# Patient Record
Sex: Male | Born: 1990 | Hispanic: No | Marital: Single | Smoking: Never smoker
Health system: Southern US, Community
[De-identification: ages and names within clinical notes are randomized; demographics above are authoritative.]

---

## 2008-01-24 ENCOUNTER — Emergency Department: Payer: Self-pay | Admitting: Emergency Medicine

## 2008-08-01 ENCOUNTER — Emergency Department (HOSPITAL_COMMUNITY): Admission: EM | Admit: 2008-08-01 | Discharge: 2008-08-01 | Payer: Self-pay | Admitting: Emergency Medicine

## 2008-11-04 ENCOUNTER — Observation Stay (HOSPITAL_COMMUNITY): Admission: EM | Admit: 2008-11-04 | Discharge: 2008-11-04 | Payer: Self-pay | Admitting: Emergency Medicine

## 2009-05-01 ENCOUNTER — Emergency Department (HOSPITAL_COMMUNITY): Admission: EM | Admit: 2009-05-01 | Discharge: 2009-05-01 | Payer: Self-pay | Admitting: Emergency Medicine

## 2011-02-18 LAB — PROTIME-INR
INR: 1.1 (ref 0.00–1.49)
Prothrombin Time: 14.2 seconds (ref 11.6–15.2)

## 2011-02-18 LAB — POCT I-STAT, CHEM 8
Chloride: 106 mEq/L (ref 96–112)
Creatinine, Ser: 1.3 mg/dL (ref 0.4–1.5)
Hemoglobin: 15.6 g/dL (ref 13.0–17.0)
Potassium: 3.2 mEq/L — ABNORMAL LOW (ref 3.5–5.1)
Sodium: 141 mEq/L (ref 135–145)

## 2011-02-18 LAB — ETHANOL: Alcohol, Ethyl (B): 216 mg/dL — ABNORMAL HIGH (ref 0–10)

## 2011-02-18 LAB — RAPID URINE DRUG SCREEN, HOSP PERFORMED: Benzodiazepines: POSITIVE — AB
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
## 2011-12-25 ENCOUNTER — Emergency Department (HOSPITAL_COMMUNITY): Payer: No Typology Code available for payment source

## 2011-12-25 ENCOUNTER — Emergency Department (HOSPITAL_COMMUNITY)
Admission: EM | Admit: 2011-12-25 | Discharge: 2011-12-25 | Disposition: A | Discharge status: Home Health | Payer: No Typology Code available for payment source | Attending: Emergency Medicine | Admitting: Emergency Medicine

## 2011-12-25 ENCOUNTER — Encounter (HOSPITAL_COMMUNITY): Payer: Self-pay | Admitting: *Deleted

## 2011-12-25 DIAGNOSIS — S139XXA Sprain of joints and ligaments of unspecified parts of neck, initial encounter: Secondary | ICD-10-CM | POA: Insufficient documentation

## 2011-12-25 DIAGNOSIS — Z79899 Other long term (current) drug therapy: Secondary | ICD-10-CM | POA: Insufficient documentation

## 2011-12-25 DIAGNOSIS — F17200 Nicotine dependence, unspecified, uncomplicated: Secondary | ICD-10-CM | POA: Insufficient documentation

## 2011-12-25 DIAGNOSIS — M546 Pain in thoracic spine: Secondary | ICD-10-CM | POA: Insufficient documentation

## 2011-12-25 DIAGNOSIS — R51 Headache: Secondary | ICD-10-CM | POA: Insufficient documentation

## 2011-12-25 DIAGNOSIS — S335XXA Sprain of ligaments of lumbar spine, initial encounter: Secondary | ICD-10-CM | POA: Insufficient documentation

## 2011-12-25 DIAGNOSIS — S39012A Strain of muscle, fascia and tendon of lower back, initial encounter: Secondary | ICD-10-CM

## 2011-12-25 DIAGNOSIS — M545 Low back pain, unspecified: Secondary | ICD-10-CM | POA: Insufficient documentation

## 2011-12-25 DIAGNOSIS — S161XXA Strain of muscle, fascia and tendon at neck level, initial encounter: Secondary | ICD-10-CM

## 2011-12-25 DIAGNOSIS — M542 Cervicalgia: Secondary | ICD-10-CM | POA: Insufficient documentation

## 2011-12-25 MED ORDER — DIAZEPAM 5 MG PO TABS: 5.0000 mg | ORAL_TABLET | Freq: Two times a day (BID) | ORAL | Status: AC

## 2011-12-25 MED ORDER — NAPROXEN 500 MG PO TABS: 500.0000 mg | ORAL_TABLET | Freq: Two times a day (BID) | ORAL | Status: DC

## 2011-12-25 MED ORDER — KETOROLAC TROMETHAMINE 60 MG/2ML IM SOLN
60.0000 mg | Freq: Once | INTRAMUSCULAR | Status: AC
  Administered 2011-12-25: 60 mg via INTRAMUSCULAR
  Filled 2011-12-25: qty 2
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
## 2011-12-25 NOTE — ED Provider Notes (Signed)
Medical screening examination/treatment/procedure(s) were performed by non-physician practitioner and as supervising physician I was immediately available for consultation/collaboration.  Doug Sou, MD 12/25/11 [phone]

## 2011-12-25 NOTE — Discharge Instructions (Signed)
Take naprosyn as prescribed for pain. Take valium as prescribed as needed for muscle spasms. Rest. Stretch daily. Heating pads to the back. Your x-rays were normal today. Follow up in 3-5 days if not improving.   Back Pain, Adult Low back pain is very common. About 1 in 5 people have back pain.The cause of low back pain is rarely dangerous. The pain often gets better over time.About half of people with a sudden onset of back pain feel better in just 2 weeks. About 8 in 10 people feel better by 6 weeks.  CAUSES Some common causes of back pain include:  Strain of the muscles or ligaments supporting the spine.   Wear and tear (degeneration) of the spinal discs.   Arthritis.   Direct injury to the back.  DIAGNOSIS Most of the time, the direct cause of low back pain is not known.However, back pain can be treated effectively even when the exact cause of the pain is unknown.Answering your caregiver's questions about your overall health and symptoms is one of the most accurate ways to make sure the cause of your pain is not dangerous. If your caregiver needs more information, he or she may order lab work or imaging tests (X-rays or MRIs).However, even if imaging tests show changes in your back, this usually does not require surgery. HOME CARE INSTRUCTIONS For many people, back pain returns.Since low back pain is rarely dangerous, it is often a condition that people can learn to [facility] their own.   Remain active. It is stressful on the back to sit or stand in one place. Do not sit, drive, or stand in one place for more than 30 minutes at a time. Take short walks on level surfaces as soon as pain allows.Try to increase the length of time you walk each day.   Do not stay in bed.Resting more than 1 or 2 days can delay your recovery.   Do not avoid exercise or work.Your body is made to move.It is not dangerous to be active, even though your back may hurt.Your back will likely heal faster if  you return to being active before your pain is gone.   Pay attention to your body when you bend and lift. Many people have less discomfortwhen lifting if they bend their knees, keep the load close to their bodies,and avoid twisting. Often, the most comfortable positions are those that put less stress on your recovering back.   Find a comfortable position to sleep. Use a firm mattress and lie on your side with your knees slightly bent. If you lie on your back, put a pillow under your knees.   Only take over-the-counter or prescription medicines as directed by your caregiver. Over-the-counter medicines to reduce pain and inflammation are often the most helpful.Your caregiver may prescribe muscle relaxant drugs.These medicines help dull your pain so you can more quickly return to your normal activities and healthy exercise.   Put ice on the injured area.   Put ice in a plastic bag.   Place a towel between your skin and the bag.   Leave the ice on for 15 to 20 minutes, 3 to 4 times a day for the first 2 to 3 days. After that, ice and heat may be alternated to reduce pain and spasms.   Ask your caregiver about trying back exercises and gentle massage. This may be of some benefit.   Avoid feeling anxious or stressed.Stress increases muscle tension and can worsen back pain.It is important to  recognize when you are anxious or stressed and learn ways to manage it.Exercise is a great option.  SEEK MEDICAL CARE IF:  You have pain that is not relieved with rest or medicine.   You have pain that does not improve in 1 week.   You have new symptoms.   You are generally not feeling well.  SEEK IMMEDIATE MEDICAL CARE IF:   You have pain that radiates from your back into your legs.   You develop new bowel or bladder control problems.   You have unusual weakness or numbness in your arms or legs.   You develop nausea or vomiting.   You develop abdominal pain.   You feel faint.  Document  Released: 10/21/2005 Document Revised: 07/03/2011 Document Reviewed: 03/11/2011 [facility] Patient Information 2012 [place], [place].
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
## 2012-04-27 ENCOUNTER — Encounter (HOSPITAL_COMMUNITY): Payer: Self-pay | Admitting: *Deleted

## 2012-04-27 ENCOUNTER — Emergency Department (HOSPITAL_COMMUNITY)
Admission: EM | Admit: 2012-04-27 | Discharge: 2012-04-27 | Disposition: A | Discharge status: Home / Self Care | Payer: Self-pay | Attending: Emergency Medicine | Admitting: Emergency Medicine

## 2012-04-27 ENCOUNTER — Emergency Department (HOSPITAL_COMMUNITY): Payer: Self-pay

## 2012-04-27 DIAGNOSIS — H1130 Conjunctival hemorrhage, unspecified eye: Secondary | ICD-10-CM | POA: Insufficient documentation

## 2012-04-27 DIAGNOSIS — S0285XA Fracture of orbit, unspecified, initial encounter for closed fracture: Secondary | ICD-10-CM

## 2012-04-27 DIAGNOSIS — K029 Dental caries, unspecified: Secondary | ICD-10-CM | POA: Insufficient documentation

## 2012-04-27 DIAGNOSIS — S0590XA Unspecified injury of unspecified eye and orbit, initial encounter: Secondary | ICD-10-CM

## 2012-04-27 DIAGNOSIS — S0280XA Fracture of other specified skull and facial bones, unspecified side, initial encounter for closed fracture: Secondary | ICD-10-CM | POA: Insufficient documentation

## 2012-04-27 DIAGNOSIS — S0510XA Contusion of eyeball and orbital tissues, unspecified eye, initial encounter: Secondary | ICD-10-CM | POA: Insufficient documentation

## 2012-04-27 MED ORDER — MORPHINE SULFATE 4 MG/ML IJ SOLN
4.0000 mg | Freq: Once | INTRAMUSCULAR | Status: AC
  Administered 2012-04-27: 4 mg via INTRAMUSCULAR
  Filled 2012-04-27: qty 1

## 2012-04-27 MED ORDER — OXYCODONE-ACETAMINOPHEN 5-325 MG PO TABS: 1.0000 | ORAL_TABLET | ORAL | Status: AC | PRN

## 2012-04-27 MED ORDER — TETRACAINE HCL 0.5 % OP SOLN
2.0000 [drp] | Freq: Once | OPHTHALMIC | Status: AC
  Administered 2012-04-27: 2 [drp] via OPHTHALMIC

## 2012-04-27 MED ORDER — TETRACAINE HCL 0.5 % OP SOLN
OPHTHALMIC | Status: AC
  Administered 2012-04-27: 2 [drp] via OPHTHALMIC
  Filled 2012-04-27: qty 2

## 2012-04-27 MED ORDER — AMOXICILLIN-POT CLAVULANATE 875-125 MG PO TABS: 1.0000 | ORAL_TABLET | Freq: Two times a day (BID) | ORAL | Status: AC
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
## 2012-04-27 NOTE — ED Notes (Signed)
Pt to and from CT, pt ambulatory.
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
## 2012-04-27 NOTE — Discharge Instructions (Signed)
Orbital Floor Fracture, Blowout  The eye sits in the bony structure of the skull called the orbit. The upper and outside walls of the orbit are very thick and strong. These walls protect the eye if the head is struck from the top or side of the eye. However, the inside wall near the nose and the orbit floor are very thin and weak. The bony floor of the orbit also acts as the roof of the air-filled space (sinus) below the orbit.  If the eye receives a direct blow from the front, all the tissues around the eye are briefly pressed together. This makes the orbital wall pressure very high. Since the weakest walls tend to give way first, the inside wall or the orbit floor may break. If the floor fractures, the tissues around the eye, including the muscle that is used to make the eye look down, may become trapped within the fracture as the floor of the orbit "blows out" into the sinus below.   CAUSES   Orbital floor fractures are caused by direct (blunt) trauma to the region of the eye.  SYMPTOMS   Assuming that there has been no injury to the eye itself, symptoms can include:   Puffiness (swelling) and bruising around the eye area (black eye).   A gurgling sound when pressure is placed on the eye area. This sound comes from air that has escaped from the sinus into the space around the eye (orbital emphysema).   Seeing two of everything - one object being higher than the other (vertical diplopia). This is the result of the muscle that moves the eye down being trapped within the fracture. Since it cannot relax, the eye is being held in a downward position relative to the other eye and cannot look up. Vertical diplopia from an orbital floor fracture is worse when looking up.   Pain around the eye when looking up.   One eye looks sunken compared to the other eye (enophthalmos).   Numbness of the cheek and upper gum on the same side of the face with the floor fracture. This is a result of nerve injury to these areas.  This nerve runs in a groove along the bone of the orbital floor on its way to the cheek and upper gums.  DIAGNOSIS   The diagnosis of an orbital floor fracture is suspected during an eye exam by an ophthalmologist. It is confirmed by X-rays or CT scan of the eye region.  TREATMENT    Orbital floor fractures are not usually treated until all of the swelling around the eye has gone away. This may take 1 or 2 weeks. Once the swelling has gone down, an ophthalmologist will see if if the muscle below the eye is still trapped within the fracture.   If there is no sign of a trapped muscle or vertical diplopia, treatment is not necessary.   If there is double vision only when looking up, a decision may be made to not do anything since most people do not spend a lot of time looking up. This may depend on the person's profession. For instance, a plumber or electrician may spend a large part of their day looking up and would therefore need treatment.   If there is persistent vertical double vision even when looking straight ahead, the ophthalmologist may try to free the muscle in the office. If this is unsuccessful, surgery is often needed.  SEEK IMMEDIATE MEDICAL CARE IF:   You have had   A drop in vision in either eye.   Swelling and bruising around either eye.   One eye seems to be "sunken" compared to the other.   You see two of everything with both eyes open when looking in any direction.   The two images get further apart when looking in a certain direction - especially up.   You have numbness of the cheek and upper gums on the side of the injury.   You develop an unexplained oral temperature over 102 F (38.9 C), or as your caregiver suggests.  Document Released: 04/16/2001 Document Revised: 10/10/2011 Document Reviewed: 02/15/2008 [facility] Patient Information 2012 [place], [place].  Fractura de suelo orbital, estallido (Orbital Floor  Fracture, Blowout) El ojo se apoya sobre una estructura sea del crneo llamada rbita. Las paredes superiores y exteriores de la rbita son Karren Burly y fuertes. Las mismas protegen el ojo si la cabeza recibe un golpe desde arriba o desde un lado. Sin embargo, la pared interior cercana a la nariz y el suelo de la rbita son Lynnae Sandhoff finas y dbiles. El suelo seo de la rbita tambin acta como techo del espacio lleno de aire (sinus) debajo de la rbita. Si el ojo recibe un golpe directo desde el frente, todos los tejidos que rodean el ojo se presionarn entre s Youth worker. Esto aumenta la presin de Nature conservation officer. Debido a que las paredes ms dbiles suelen dar Stage manager, la pared interna o el suelo de la rbita podra romperse. Si se fractura el suelo orbital, los tejidos que rodean el ojo, incluido el msculo que se [place] para Radio producer que el ojo mire [place], podra quedar atrapado dentro de la fractura mientras que el suelo orbital "explota" hacia el sinus.  CAUSAS Las fracturas de suelo orbital estn causadas por traumatismos directos (contundentes) en la regin del ojo. SNTOMAS Si suponemos que no ha habido una lesin directa al ojo, los sntomas pueden incluir:  Hinchazn (inflamacin) y dolor en el rea del ojo ("ojo negro").   Un sonido ahogado cuando se hace presin sobre el rea. Este sonido proviene del aire que se escapa desde el sinus hacia el espacio que rodea el ojo (enfisema orbital).   Visin doble - un objeto ms alto que el otro (diplopa vertical). Es el resultado de que el msculo que mueve al ojo est atrapado con la fractura. Debido a que no puede relajarse, el ojo queda en una posicin hacia abajo en relacin con el otro y no puede mirar [place]. La diplopa vertical por una fractura de suelo orbital es peor al mirar [place] arriba.   Dolor alrededor del ojo al mirar Normajean [place] arriba.   Un ojo se ve ms hundido en comparacin con el otro (enoftalmos).    Adormecimiento de Curator y la parte superior de la enca del mismo lado de la cara en la que se encuentra la fractura. Es el resultado de una lesin del nervio en esa zona. Este nervio se encuentra en una hendidura a lo largo del hueso del suelo orbital y Investment banker, operational las mejillas y las encas superiores.  DIAGNSTICO El diagnstico de una fractura de suelo orbital se presume a partir de un examen realizado por un oftalmlogo. Se confirma mediante rayos X o una tomografa computada del rea del ojo. TRATAMIENTO  Las fracturas del suelo orbital no suelen tratarse hasta que haya desaparecido la hinchazn. Esto puede durar entre 1 y 2 semanas. Una vez que la hinchazn ha desaparecido, Administrator, arts  si el msculo debajo del ojo est an atrapado dentro de Printmaker.   Si no hay seales de que el msculo est atrapado o de que haya una diplopa vertical no ser necesario el tratamiento.   Si hay visin doble slo cuando se mira hacia arriba, se podr decidir no hacer nada debido a que la [employer] no pasan mucho tiempo mirando [place] arriba. Esto podr depender de la profesin de Dealer. Por ejemplo, un plomero o electricista podra pasar gran parte del da mirando hacia arriba y por lo tanto Network engineer.   Si la visin doble vertical persiste incluso cuando los ojos miran hacia adelante, Programme researcher, broadcasting/film/video. Si fracasa, se necesitar ciruga.  SOLICITE ATENCIN MDICA DE INMEDIATO SI: Si ha recibido un golpe en la zona de los ojos y tiene:  Prdida de visin en cualquiera de los ojos.   Hinchazn y dolor alrededor del ojo.   Un ojo se ve ms hundido en comparacin con el otro.   Ve doble con ambos ojos al mirar en cualquier direccin.   Las dos imgenes se alejan al mirar en cierta direccin - especialmente Norm[place] arriba.   Tiene dolor en las mejillas y en las encas superiores del lado de la lesin.   La  temperatura oral se eleva sin motivo por encima de 102 F (38.9 C) o segn le indique el profesional que lo asiste.  Document Released: 07/31/2005 Document Revised: 10/10/2011 [facility] Patient Information 2012 [place], [place].  Orbital Floor Fracture Care After  An orbit is the boney socket of the skull where the eye sits. An orbital fracture is a break in the boney socket. HOME CARE  Follow your doctor's advice about when you should:   Be active.   Start wearing contacts again.   Drive.   Bend over or lift objects.   Put ice on your eye.   Only take medicine as told by your doctor.  GET HELP RIGHT AWAY IF:   You have vision changes.   You start to lose feeling (numbness) in the cheek.   There is a bad smell coming from the wound.   There is yellowish-white fluid (pus) coming from your eye.   You have trouble breathing.   Your bruising gets worse around the eye.   You have redness, puffiness (swelling), or pain that gets worse.   You develop a rash.   You have a fever.  MAKE SURE YOU:   Understand these instructions.   Will watch your condition.   Will get help right away if you are not doing well or get worse.  Document Released: 10/09/2009 Document Revised: 10/10/2011 Document Reviewed: 10/09/2009 [facility] Patient Information 2012 [place], [place].  Fractura orbital con estallido del globo ocular, cuidados posteriores  (Orbital Floor Fracture, Care After) Siga estas instrucciones durante las prximas semanas. Estas indicaciones le proporcionan informacin general acerca de cmo deber cuidarse despus del procedimiento. El mdico tambin podr darle instrucciones especficas. El tratamiento ha sido planificado segn las prcticas mdicas actuales, pero en algunos casos pueden ocurrir problemas. Comunquese con el mdico si tiene algn problema o tiene preguntas despus del procedimiento.  INSTRUCCIONES PARA EL CUIDADO DOMICILIARIO  Siga las instrucciones del  mdico relacionadas con las actividades, ejercicio, uso de lentes de contacto y la conduccin de automviles.   Una vez que se encuentre en su hogar, aplique una bolsa de hielo en la zona de la [place], lo que puede [place]  a disminuir las [employer] y Wellsite geologist.   Slo tome medicamentos de Sales promotion account executive o prescriptos para Primary school teacher, las [place], o bajar la fiebre segn las indicaciones de su mdico.   Evite doblarse y Lexicographer peso a menos que se le indique lo contrario.  SOLICITE ATENCIN MDICA SI:  Observa modificaciones o prdida de la visin o visin doble.   Adormecimiento de Set designer.   Un olor ftido proviene de la herida o del vendaje.   Aparece pus o una secrecin que proviene de la herida o alrededor del ojo.   Aumento del hematoma alrededor del ojo (rbita).   Observa enrojecimiento, hinchazn o Teacher, music en la herida.   Presenta una temperatura oral superior a 102 F (38.9 C).  SOLICITE ATENCIN MDICA INMEDIATAMENTE SI:  Aparece una erupcin cutnea.   Presenta dificultad para respirar.  Document Released: 01/17/2009 Document Revised: 10/10/2011 [facility] Patient Information 2012 [place], [place].  RESOURCE GUIDE  Dental Problems  Patients with Medicaid: [facility] [phone] [address]                                           [phone] [address] Phone:  [phone]                                                   Phone:  [phone]  If unable to pay or uninsured, contact:  [facility] or [facility]. to become qualified for the adult dental clinic.  Chronic Pain Problems Contact [facility]  [phone] Patients need to be referred by their primary care doctor.  Insufficient Money for Medicine Contact United Way:  call "211" or [facility] [phone].  No Primary Care Doctor Call [facility]  [phone] Other agencies that  provide inexpensive medical care    [facility]  [phone]    [facility]  [phone]    [facility]  [phone]    [facility]  [phone]    [facility]  [phone]    [facility]  [phone]  Psychological Services [facility]  [phone] [facility]  [phone] [facility] Mental Health   [phone] (emergency services [phone])  Abuse/Neglect [facility] Child Abuse Hotline [phone] [facility] Child Abuse Hotline 778-279-5768 (After Hours)  Emergency Shelter [facility] [phone]  Maternity Homes Room at the [place] [facility] [place] [phone] [facility] Services [phone]  MRSA Hotline #:   812-346-8287    [facility] Resources  [facility]  United Way                           [facility] Dept. [address] [place]                     [address]         [address]  [address]  Cristobal Goldmann Phone:  [phone]                                  Phone:  [phone]                   Phone:  [phone]  [facility] Mental Health Phone:  [phone]  [facility] Child Abuse Hotline [phone] (After Hours)
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
## 2012-08-29 ENCOUNTER — Encounter (HOSPITAL_COMMUNITY): Payer: Self-pay | Admitting: Emergency Medicine

## 2012-08-29 ENCOUNTER — Emergency Department (HOSPITAL_COMMUNITY)
Admission: EM | Admit: 2012-08-29 | Discharge: 2012-08-30 | Discharge status: Against Medical Advice | Payer: Self-pay | Attending: Emergency Medicine | Admitting: Emergency Medicine

## 2012-08-29 DIAGNOSIS — R05 Cough: Secondary | ICD-10-CM | POA: Insufficient documentation

## 2012-08-29 DIAGNOSIS — R062 Wheezing: Secondary | ICD-10-CM | POA: Insufficient documentation

## 2012-08-29 DIAGNOSIS — R059 Cough, unspecified: Secondary | ICD-10-CM | POA: Insufficient documentation

## 2012-08-29 NOTE — ED Notes (Signed)
Pt reports SOB cough and difficulty sleeping over the last week mild wheezing all fields noted

## 2017-07-28 ENCOUNTER — Encounter (HOSPITAL_COMMUNITY): Payer: Self-pay

## 2017-07-28 ENCOUNTER — Emergency Department (HOSPITAL_COMMUNITY)
Admission: EM | Admit: 2017-07-28 | Discharge: 2017-07-28 | Disposition: A | Discharge status: Home / Self Care | Payer: Self-pay | Attending: Emergency Medicine | Admitting: Emergency Medicine

## 2017-07-28 DIAGNOSIS — Z791 Long term (current) use of non-steroidal anti-inflammatories (NSAID): Secondary | ICD-10-CM | POA: Insufficient documentation

## 2017-07-28 DIAGNOSIS — K047 Periapical abscess without sinus: Secondary | ICD-10-CM | POA: Insufficient documentation

## 2017-07-28 DIAGNOSIS — R220 Localized swelling, mass and lump, head: Secondary | ICD-10-CM

## 2017-07-28 MED ORDER — CLINDAMYCIN HCL 300 MG PO CAPS: 300.0000 mg | ORAL_CAPSULE | Freq: Three times a day (TID) | ORAL | 0 refills | Status: DC

## 2017-07-28 MED ORDER — LIDOCAINE VISCOUS 2 % MT SOLN: 20.0000 mL | OROMUCOSAL | 0 refills | Status: DC | PRN

## 2017-07-28 MED ORDER — OXYCODONE HCL 5 MG PO TABS
5.0000 mg | ORAL_TABLET | Freq: Once | ORAL | Status: AC
  Administered 2017-07-28: 5 mg via ORAL
  Filled 2017-07-28: qty 1

## 2017-07-28 MED ORDER — TRAMADOL HCL 50 MG PO TABS: 50.0000 mg | ORAL_TABLET | Freq: Four times a day (QID) | ORAL | 0 refills | Status: DC | PRN

## 2017-07-28 MED ORDER — KETOROLAC TROMETHAMINE 30 MG/ML IJ SOLN
30.0000 mg | Freq: Once | INTRAMUSCULAR | Status: AC
  Administered 2017-07-28: 30 mg via INTRAMUSCULAR
  Filled 2017-07-28: qty 1

## 2017-07-28 MED ORDER — CLINDAMYCIN HCL 300 MG PO CAPS
300.0000 mg | ORAL_CAPSULE | Freq: Once | ORAL | Status: AC
  Administered 2017-07-28: 300 mg via ORAL
  Filled 2017-07-28: qty 1

## 2017-07-28 MED ORDER — ACETAMINOPHEN 500 MG PO TABS
1000.0000 mg | ORAL_TABLET | Freq: Once | ORAL | Status: AC
  Administered 2017-07-28: 1000 mg via ORAL
  Filled 2017-07-28: qty 2

## 2017-07-28 NOTE — ED Provider Notes (Signed)
[facility]-EMERGENCY DEPT Provider Note   CSN: [id number] Arrival date & time: 07/28/17  1527     History   Chief Complaint Chief Complaint  Patient presents with  . Facial Swelling  . Dental Pain    HPI Brannon Levene is a 26 y.o. male.  HPI 26 year old male presents to the emergency department today with complaints of dental pain and facial swelling. The patient states that over the past few days he's had upper respiratory tract infection. States that his right lower molar has been hurting him for the past 2 days. States that he took some Tylenol last night for the pain. States he woke up this morning with facial swelling to his right lower jaw. Patient states the pain has caused him to limit his by mouth intake. Patient denies any fever or chills at home. Has not taken anything today for his pain prior to arrival. Eating and moving makes the pain worse. Patient denies any difficulties breathing or difficulty swallowing. He does not currently have a dentist. Does note that he does have a bad tooth that needs to be removed.   History reviewed. No pertinent past medical history.  There are no active problems to display for this patient.   History reviewed. No pertinent surgical history.     Home Medications    Prior to Admission medications   Medication Sig Start Date End Date Taking? Authorizing Provider  clindamycin (CLEOCIN) 300 MG capsule Take 1 capsule (300 mg total) by mouth 3 (three) times daily. 07/28/17   Rise Mu, PA-C  ibuprofen (ADVIL,MOTRIN) 200 MG tablet Take 400 mg by mouth every 6 (six) hours as needed. For pain    [provider]  traMADol (ULTRAM) 50 MG tablet Take 1 tablet (50 mg total) by mouth every 6 (six) hours as needed. 07/28/17   Rise Mu, PA-C    Family History No family history on file.  Social History Social History  Substance Use Topics  . Smoking status: Never Smoker  . Smokeless tobacco: Never Used  .  Alcohol use Yes     Comment: occasionally     Allergies   Patient has no known allergies.   Review of Systems Review of Systems  Constitutional: Negative for chills and fever.  HENT: Positive for dental problem and facial swelling. Negative for trouble swallowing and voice change.   Gastrointestinal: Negative for nausea and vomiting.     Physical Exam Updated Vital Signs BP (!) 126/107 (BP Location: Left Arm)   Pulse (!) 110   Temp (!) 100.6 F (38.1 C) (Oral)   Resp 17   Ht 6' (1.829 m)   Wt 127.9 kg (282 lb)   SpO2 97%   BMI 38.25 kg/m   Physical Exam  Constitutional: He appears well-developed and well-nourished. No distress.  HENT:  Head: Normocephalic and atraumatic.  Mouth/Throat: Uvula is midline, oropharynx is clear and moist and mucous membranes are normal. No trismus in the jaw. No uvula swelling.    Oropharynx is clear. Facial swelling localized to the right lower jaw. Mild erythema. Mild tenderness to palpation of the right lower molar and gumline. No palpable abscess or area of fluctuance. No purulent drainage. Oropharynx is clear without any signs of breathing or swallowing compromise. No trismus noted.  Eyes: Right eye exhibits no discharge. Left eye exhibits no discharge. No scleral icterus.  Neck: Normal range of motion. Neck supple.  No c spine midline tenderness. No paraspinal tenderness. No deformities or step  offs noted. Full ROM. Supple. No nuchal rigidity.    Cardiovascular: Regular rhythm.   Mild tachycardia noted.  Pulmonary/Chest: No respiratory distress.  Musculoskeletal: Normal range of motion.  Lymphadenopathy:    He has no cervical adenopathy.  Neurological: He is alert.  Skin: Skin is warm and dry. Capillary refill takes less than 2 seconds. No pallor.  Psychiatric: His behavior is normal. Judgment and thought content normal.  Nursing note and vitals reviewed.    ED Treatments / Results  Labs (all labs ordered are listed, but only  abnormal results are displayed) Labs Reviewed - No data to display  EKG  EKG Interpretation None       Radiology No results found.  Procedures Procedures (including critical care time)  Medications Ordered in ED Medications  oxyCODONE (Oxy IR/ROXICODONE) immediate release tablet 5 mg (not administered)  acetaminophen (TYLENOL) tablet 1,000 mg (1,000 mg Oral Given 07/28/17 1936)  clindamycin (CLEOCIN) capsule 300 mg (300 mg Oral Given 07/28/17 1937)  ketorolac (TORADOL) 30 MG/ML injection 30 mg (30 mg Intramuscular Given 07/28/17 1936)     Initial Impression / Assessment and Plan / ED Course  I have reviewed the triage vital signs and the nursing notes.  Pertinent labs & imaging results that were available during my care of the patient were reviewed by me and considered in my medical decision making (see chart for details).     Patient presents to the ED with tooth pain and right facial swelling. Likely abscessed tooth. I do not appreciate any gross abscess on my exam that I could drain in the ED. Patient does not have any sublingual or submandibular swelling of the concerning for liquids angina. Oropharynx is clear. No signs of deep neck infection. She does have a slight fever in the ED with mild tachycardia likely due to the fever vs dehydration. Patient has had poor by mouth intake due to the pain. No hypotension. However, patient has been able tolerate by mouth fluids in the ED. This was treated with Tylenol, Toradol. Patient swelling and fever likely coming from abscessed tooth. Doubt facial cellulitis. Patient needs close follow-up with PCP and dentist. He does not have a dentist. Have given him resources and a free dental clinic this weekend. Patient given his first dose of antibiotics in the ED. We'll send patient home on clindamycin. Will give patient short course of pain medicine.  Pt is hemodynamically stable, in NAD, & able to ambulate in the ED. Evaluation does not show  pathology that would require ongoing emergent intervention or inpatient treatment. I explained the diagnosis to the patient. Pain has been managed & has no complaints prior to dc. Pt is comfortable with above plan and is stable for discharge at this time. All questions were answered prior to disposition. Strict return precautions for f/u to the ED were discussed. Encouraged follow up with PCP.   Final Clinical Impressions(s) / ED Diagnoses   Final diagnoses:  Dental abscess  Facial swelling    New Prescriptions New Prescriptions   CLINDAMYCIN (CLEOCIN) 300 MG CAPSULE    Take 1 capsule (300 mg total) by mouth 3 (three) times daily.   TRAMADOL (ULTRAM) 50 MG TABLET    Take 1 tablet (50 mg total) by mouth every 6 (six) hours as needed.     Rise Mu, PA-C 07/28/17 2101    Jacalyn Lefevre, MD 07/28/17 2229

## 2017-07-28 NOTE — Discharge Instructions (Signed)
Please start taking antibiotics for your dental abscess. May continue using Motrin and Tylenol at home. May take 600 mg ibuprofen or Motrin every 4-6 hours. May take 500 mg of Tylenol every 4-6 hours. Have acute new small amount of tramadol to take for pain told to sleep at night and to eat. May take this with the Motrin and Tylenol. Do not take any additional Tylenol or Motrin this evening as he had been given a dose in the ED. Have been giving her first dose of antibiotic in the ED. Likely close follow-up with a dentist.  Given her resource guide. There is also a free dental clinic Friday and Saturday of this week. Have given you details for this. Return to the ED if he develops difficulty breathing, difficulty swallowing, worsening fevers, worsening swelling, worsening pain.

## 2017-07-28 NOTE — ED Triage Notes (Signed)
Patient states he began having right facial swelling. Patient has dental pain right lower jaw. Patient states he has a cracked cap.

## 2018-09-01 ENCOUNTER — Other Ambulatory Visit: Payer: Self-pay

## 2018-09-01 ENCOUNTER — Encounter (HOSPITAL_COMMUNITY): Payer: Self-pay | Admitting: Emergency Medicine

## 2018-09-01 ENCOUNTER — Emergency Department (HOSPITAL_COMMUNITY)
Admission: EM | Admit: 2018-09-01 | Discharge: 2018-09-01 | Disposition: A | Discharge status: Home / Self Care | Payer: Self-pay | Attending: Emergency Medicine | Admitting: Emergency Medicine

## 2018-09-01 DIAGNOSIS — R51 Headache: Secondary | ICD-10-CM | POA: Insufficient documentation

## 2018-09-01 DIAGNOSIS — R519 Headache, unspecified: Secondary | ICD-10-CM

## 2018-09-01 LAB — BASIC METABOLIC PANEL
Anion gap: 8 (ref 5–15)
BUN: 12 mg/dL (ref 6–20)
CALCIUM: 9.7 mg/dL (ref 8.9–10.3)
CO2: 25 mmol/L (ref 22–32)
CREATININE: 1.19 mg/dL (ref 0.61–1.24)
Chloride: 106 mmol/L (ref 98–111)
GFR calc non Af Amer: >60 mL/min (ref >60)
Glucose, Bld: 82 mg/dL (ref 70–99)
Potassium: 3.9 mmol/L (ref 3.5–5.1)
SODIUM: 139 mmol/L (ref 135–145)

## 2018-09-01 LAB — CBC
HEMATOCRIT: 49.2 % (ref 39.0–52.0)
Hemoglobin: 14.8 g/dL (ref 13.0–17.0)
MCH: 25.5 pg — AB (ref 26.0–34.0)
MCHC: 30.1 g/dL (ref 30.0–36.0)
MCV: 84.7 fL (ref 80.0–100.0)
NRBC: 0 % (ref 0.0–0.2)
Platelets: 421 10*3/uL — ABNORMAL HIGH (ref 150–400)
RBC: 5.81 MIL/uL (ref 4.22–5.81)
RDW: 14.3 % (ref 11.5–15.5)
WBC: 9.7 10*3/uL (ref 4.0–10.5)

## 2018-09-01 MED ORDER — DIPHENHYDRAMINE HCL 50 MG/ML IJ SOLN
12.5000 mg | Freq: Once | INTRAMUSCULAR | Status: AC
  Administered 2018-09-01: 12.5 mg via INTRAVENOUS
  Filled 2018-09-01: qty 1

## 2018-09-01 MED ORDER — ACETAMINOPHEN 325 MG PO TABS
650.0000 mg | ORAL_TABLET | Freq: Once | ORAL | Status: AC
  Administered 2018-09-01: 650 mg via ORAL
  Filled 2018-09-01: qty 2

## 2018-09-01 MED ORDER — SODIUM CHLORIDE 0.9 % IV BOLUS
1000.0000 mL | Freq: Once | INTRAVENOUS | Status: AC
  Administered 2018-09-01: 1000 mL via INTRAVENOUS

## 2018-09-01 MED ORDER — PROCHLORPERAZINE EDISYLATE 10 MG/2ML IJ SOLN
10.0000 mg | Freq: Once | INTRAMUSCULAR | Status: AC
  Administered 2018-09-01: 10 mg via INTRAVENOUS
  Filled 2018-09-01: qty 2

## 2018-09-01 MED ORDER — KETOROLAC TROMETHAMINE 15 MG/ML IJ SOLN
15.0000 mg | Freq: Once | INTRAMUSCULAR | Status: AC
  Administered 2018-09-01: 15 mg via INTRAVENOUS
  Filled 2018-09-01: qty 1

## 2018-09-01 NOTE — Discharge Instructions (Signed)
Please follow up with your primary care provider within 5-7 days for re-evaluation of your symptoms. If you do not have a primary care provider, information for a healthcare clinic has been provided for you to make arrangements for follow up care. Please return to the emergency department for any new or worsening symptoms. ° °

## 2018-09-01 NOTE — ED Provider Notes (Signed)
[facility] EMERGENCY DEPARTMENT Provider Note   CSN: [id number] Arrival date & time: 09/01/18  1602     History   Chief Complaint Chief Complaint  Patient presents with  . Headache  . Dizziness    HPI Jay Pratt Vital is a 27 y.o. male.  HPI  Pt is a 27 y/o male with no PMHx who presents to the ED today c/o a headache that began yesterday. Pts states that yesterday he began to have HA on the top of his head. Now HA has migrated to posterior head.  States headache began while he was in the shower. Describes pain as sharp. Pain waxes and wanes, but has gotten worse today that yesterday.  Reports associated photophobia, intermittent lightheadedness, blurred vision. Denies numbness, weakness.  No nausea or vomiting.  Mother at bedside states that patient had a syncopal episode secondary to pain yesterday. He fell to his knees and did not hit his head. States that initially today pain was 8/10 and after tylenol given in the ED his HA is now 5/10.   Pt states he has had a migraine in the past when he was dehydrated.   History reviewed. No pertinent past medical history.  There are no active problems to display for this patient.   History reviewed. No pertinent surgical history.    Home Medications    Prior to Admission medications   Medication Sig Start Date End Date Taking? Authorizing Provider  clindamycin (CLEOCIN) 300 MG capsule Take 1 capsule (300 mg total) by mouth 3 (three) times daily. 07/28/17   Rise Mu, PA-C  ibuprofen (ADVIL,MOTRIN) 200 MG tablet Take 400 mg by mouth every 6 (six) hours as needed. For pain    [provider]  lidocaine (XYLOCAINE) 2 % solution Use as directed 20 mLs in the mouth or throat as needed for mouth pain. 07/28/17   Rise Mu, PA-C  traMADol (ULTRAM) 50 MG tablet Take 1 tablet (50 mg total) by mouth every 6 (six) hours as needed. 07/28/17   Rise Mu, PA-C    Family  History History reviewed. No pertinent family history.  Social History Social History   Tobacco Use  . Smoking status: Never Smoker  . Smokeless tobacco: Never Used  Substance Use Topics  . Alcohol use: Yes    Comment: occasionally  . Drug use: No    Allergies   Patient has no known allergies.   Review of Systems Review of Systems  Constitutional: Negative for chills and fever.  HENT: Negative for ear pain and sore throat.   Eyes: Positive for photophobia and visual disturbance.  Respiratory: Negative for shortness of breath.   Cardiovascular: Negative for chest pain.  Gastrointestinal: Negative for abdominal pain, constipation, diarrhea, nausea and vomiting.  Genitourinary: Negative for dysuria and hematuria.  Musculoskeletal: Negative for back pain.  Skin: Negative for rash.  Neurological: Positive for syncope, light-headedness and headaches. Negative for weakness and numbness.  All other systems reviewed and are negative.   Physical Exam Updated Vital Signs BP (!) 107/54   Pulse (!) 58   Temp 98.3 F (36.8 C) (Oral)   Resp 16   SpO2 100%   Physical Exam  Constitutional: He appears well-developed and well-nourished.  Non-toxic appearance. He does not appear ill. No distress.  Talking on his cell phone during my evaluation  HENT:  Head: Normocephalic and atraumatic.  Eyes: Pupils are equal, round, and reactive to light. Conjunctivae and EOM are normal. Right  eye exhibits no nystagmus. Left eye exhibits no nystagmus.  Neck: Neck supple.  No carotid bruits  Cardiovascular: Normal rate, regular rhythm and normal heart sounds.  No murmur heard. Pulmonary/Chest: Effort normal and breath sounds normal. No respiratory distress.  Abdominal: Soft. Bowel sounds are normal. There is no tenderness.  Musculoskeletal: He exhibits no edema.  Neurological: He is alert.  Mental Status:  Alert, thought content appropriate, able to give a coherent history. Speech fluent  without evidence of aphasia. Able to follow 2 step commands without difficulty.  Cranial Nerves:  II:   pupils equal, round, reactive to light III,IV, VI: ptosis not present, extra-ocular motions intact bilaterally  V,VII: smile symmetric, facial light touch sensation equal VIII: hearing grossly normal to voice  X: uvula elevates symmetrically  XI: bilateral shoulder shrug symmetric and strong XII: midline tongue extension without fassiculations Motor:  Normal tone. 5/5 strength of BUE and BLE major muscle groups including strong and equal grip strength and dorsiflexion/plantar flexion Sensory: light touch normal in all extremities. (has small area of numbness to RLE where he had prior injury, chronic, unchanged) Cerebellar: normal finger-to-nose with bilateral upper extremities, normal heel to shin Gait: normal gait and balance. Able to walk on toes and heels with ease.  Negative romberg  Skin: Skin is warm and dry. Capillary refill takes less than 2 seconds.  Psychiatric: He has a normal mood and affect.  Nursing note and vitals reviewed.   ED Treatments / Results  Labs (all labs ordered are listed, but only abnormal results are displayed) Labs Reviewed  CBC - Abnormal; Notable for the following components:      Result Value   MCH 25.5 (*)    Platelets 421 (*)    All other components within normal limits  BASIC METABOLIC PANEL    EKG EKG Interpretation  Date/Time:  Tuesday September 01 2018 16:09:22 EDT Ventricular Rate:  89 PR Interval:  138 QRS Duration: 84 QT Interval:  348 QTC Calculation: 423 R Axis:   80 Text Interpretation:  Sinus rhythm with marked sinus arrhythmia Cannot rule out Anterior infarct , age undetermined Abnormal ECG No prior ECG for comparison.  T wave inversion in lead 3.  No STEMI Confirmed by Theda Belfast ([id number]) on 09/01/2018 7:56:24 PM   Radiology No results found.  Procedures Procedures (including critical care time)  Medications Ordered  in ED Medications  acetaminophen (TYLENOL) tablet 650 mg (650 mg Oral Given 09/01/18 1626)  sodium chloride 0.9 % bolus 1,000 mL (1,000 mLs Intravenous New Bag/Given 09/01/18 2135)  ketorolac (TORADOL) 15 MG/ML injection 15 mg (15 mg Intravenous Given 09/01/18 2131)  prochlorperazine (COMPAZINE) injection 10 mg (10 mg Intravenous Given 09/01/18 2131)  diphenhydrAMINE (BENADRYL) injection 12.5 mg (12.5 mg Intravenous Given 09/01/18 2130)     Initial Impression / Assessment and Plan / ED Course  I have reviewed the triage vital signs and the nursing notes.  Pertinent labs & imaging results that were available during my care of the patient were reviewed by me and considered in my medical decision making (see chart for details).      Final Clinical Impressions(s) / ED Diagnoses   Final diagnoses:  Nonintractable headache, unspecified chronicity pattern, unspecified headache type   Patient coming in with headache that began yesterday.  No neuro deficits on exam.  Exam and history not concerning for [facility], ICH, Meningitis, or temporal arteritis. Pt is afebrile with no focal neuro deficits, nuchal rigidity, or significant vision changes.  Is associated  with photophobia.  Headache completely resolved after medication in the ED and is now requesting discharge. Pt is to follow up with PCP.Pt verbalizes understanding and is agreeable with plan to dc.  Return precautions were discussed with the patient and his family at bedside.  They voiced understanding of the plan and reasons to return to ED.   ED Discharge Orders    None       Rayne Du 09/01/18 2248    Tegeler, Canary Brim, MD 09/01/18 [id number]

## 2018-09-01 NOTE — ED Triage Notes (Signed)
Pt presents to ED for assessment of headache from yesterday at 2pm.  Sudden onset while patient was getting ready to get in shower.  States he knelt down because the pain was so severe, and then states he had a syncopal episode while on his knees due to pain.  C/o blurred vision, dizziness, nausea.  States "worse than a migraine" but pt has never had migraine before.

## 2018-09-01 NOTE — ED Notes (Signed)
Results reviewed, no changes in acuity at this time 

## 2018-09-01 NOTE — ED Triage Notes (Addendum)
PERLLA intact.  Pt laughing with daughter, joking with RN, NAD in triage

## 2023-03-04 ENCOUNTER — Emergency Department (HOSPITAL_COMMUNITY): Payer: Medicaid Other

## 2023-03-04 ENCOUNTER — Other Ambulatory Visit: Payer: Self-pay

## 2023-03-04 ENCOUNTER — Emergency Department (HOSPITAL_COMMUNITY)
Admission: EM | Admit: 2023-03-04 | Discharge: 2023-03-05 | Disposition: A | Discharge status: Home / Self Care | Payer: Medicaid Other | Attending: Emergency Medicine | Admitting: Emergency Medicine

## 2023-03-04 DIAGNOSIS — M25552 Pain in left hip: Secondary | ICD-10-CM

## 2023-03-04 DIAGNOSIS — M545 Low back pain, unspecified: Secondary | ICD-10-CM

## 2023-03-04 DIAGNOSIS — X501XXA Overexertion from prolonged static or awkward postures, initial encounter: Secondary | ICD-10-CM | POA: Insufficient documentation

## 2023-03-04 NOTE — ED Triage Notes (Signed)
Pt via POV c/o lower back pain, worse with deep inspiration, since earlier today when he bent over to pick up something and twisted in an uncomfortable way. Also reports left shoulder spasms x 1 month.

## 2023-03-04 NOTE — ED Provider Triage Note (Signed)
Emergency Medicine Provider Triage Evaluation Note  Atreus Hasz , a 32 y.o. male  was evaluated in triage.  Pt complains of left-sided low back pain and left hip pain.  States he was bending over to pick up an object at approximately 3 PM when he felt a pop and noticed a sudden sharp pain.  Pain is worsened with any type of movement or deep inspiration.  Denies saddle paresthesias, history of injection drug use, fevers, urinary or fecal incontinence, lower extremity numbness or weakness.  Review of Systems  Positive: As above Negative: As above  Physical Exam  BP (!) 167/101 (BP Location: Right Arm)   Pulse (!) 110   Temp 98.5 F (36.9 C) (Oral)   Resp 20   Ht 6' (1.829 m)   Wt (!) 147.4 kg   SpO2 95%   BMI 44.08 kg/m  Gen:   Awake, no distress, obese Resp:  Normal effort  MSK:   Moves extremities without difficulty  Other:    Medical Decision Making  Medically screening exam initiated at 9:36 PM.  Appropriate orders placed.  Koren Bound was informed that the remainder of the evaluation will be completed by another provider, this initial triage assessment does not replace that evaluation, and the importance of remaining in the ED until their evaluation is complete.  Imaging ordered   Mora Bellman 03/04/23 2137

## 2023-03-05 MED ORDER — METHOCARBAMOL 500 MG PO TABS: 500.0000 mg | ORAL_TABLET | Freq: Two times a day (BID) | ORAL | 0 refills | Status: DC

## 2023-03-05 MED ORDER — LIDOCAINE 5 % EX PTCH
2.0000 | MEDICATED_PATCH | CUTANEOUS | Status: DC
  Administered 2023-03-05: 2 via TRANSDERMAL
  Filled 2023-03-05: qty 2

## 2023-03-05 MED ORDER — OXYCODONE-ACETAMINOPHEN 5-325 MG PO TABS
2.0000 | ORAL_TABLET | Freq: Once | ORAL | Status: AC
  Administered 2023-03-05: 2 via ORAL
  Filled 2023-03-05: qty 2

## 2023-03-05 NOTE — ED Provider Notes (Signed)
[place] EMERGENCY DEPARTMENT AT [facility] Provider Note   CSN: [id number] Arrival date & time: 03/04/23  2104     History  Chief Complaint  Patient presents with   Back Pain    Jay Pratt is a 32 y.o. male.   Back Pain Patient is a 32 year old male with no medical history -- he states he does not see a PCP but states he did have labs done last year which were normal per pt.   Patient presents emergency room today with complaints of left-sided low back pain that began around 3 PM when he was bending over to pick up an object on the ground he felt a sudden sharp pain in his left side and states that he has had that pain since although it is much improved.  He has not taken medications.  He states it is worse with certain movements specifically bending to his left.  He denies any middle of the back pain he denies any falls or trauma.  He is on a blood thinner as he is not a cancer patient denies any history of IV drug use.  He denies any chest pain or difficulty breathing he says that earlier it was painful leg elevated deep breath but that has resolved.  No urinary symptoms, no bowel or bladder incontinence.  Denies any weakness or numbness of either leg     Home Medications Prior to Admission medications   Medication Sig Start Date End Date Taking? Authorizing Provider  clindamycin (CLEOCIN) 300 MG capsule Take 1 capsule (300 mg total) by mouth 3 (three) times daily. 07/28/17   Rise Mu, PA-C  ibuprofen (ADVIL,MOTRIN) 200 MG tablet Take 400 mg by mouth every 6 (six) hours as needed. For pain    [provider]  lidocaine (XYLOCAINE) 2 % solution Use as directed 20 mLs in the mouth or throat as needed for mouth pain. 07/28/17   Rise Mu, PA-C  traMADol (ULTRAM) 50 MG tablet Take 1 tablet (50 mg total) by mouth every 6 (six) hours as needed. 07/28/17   Rise Mu, PA-C      Allergies    Patient has no known allergies.     Review of Systems   Review of Systems  Musculoskeletal:  Positive for back pain.    Physical Exam Updated Vital Signs BP (!) 158/105 (BP Location: Right Arm)   Pulse (!) 104   Temp 98.5 F (36.9 C) (Oral)   Resp 19   Ht 6' (1.829 m)   Wt (!) 147.4 kg   SpO2 98%   BMI 44.08 kg/m  Physical Exam Vitals and nursing note reviewed.  Constitutional:      General: He is not in acute distress.    Comments: Pleasant well-appearing 32 year old.  In no acute distress.  Sitting comfortably in bed.  Able answer questions appropriately follow commands. No increased work of breathing. Speaking in full sentences.   HENT:     Head: Normocephalic and atraumatic.     Nose: Nose normal.  Eyes:     General: No scleral icterus. Cardiovascular:     Rate and Rhythm: Normal rate and regular rhythm.     Pulses: Normal pulses.     Heart sounds: Normal heart sounds.  Pulmonary:     Effort: Pulmonary effort is normal. No respiratory distress.     Breath sounds: No wheezing.  Abdominal:     Palpations: Abdomen is soft.     Tenderness: There  is no abdominal tenderness.  Musculoskeletal:     Cervical back: Normal range of motion.     Right lower leg: No edema.     Left lower leg: No edema.     Comments: No midline C, T, L-spine tenderness  There is some left-sided muscular tenderness of the low back.  He has several trigger points which reproduces discomfort.  Able to stand and walk without difficulty, bilateral lower extremity strength 5/5.  Sensation normal in bilateral lower extremities.  Skin:    General: Skin is warm and dry.     Capillary Refill: Capillary refill takes less than 2 seconds.  Neurological:     Mental Status: He is alert. Mental status is at baseline.  Psychiatric:        Mood and Affect: Mood normal.        Behavior: Behavior normal.     ED Results / Procedures / Treatments   Labs (all labs ordered are listed, but only abnormal results are displayed) Labs Reviewed  - No data to display  EKG None  Radiology DG Lumbar Spine Complete  Result Date: 03/04/2023 CLINICAL DATA:  Low back pain EXAM: LUMBAR SPINE - COMPLETE 4+ VIEW COMPARISON:  12/25/2011 FINDINGS: There is no evidence of lumbar spine fracture. Alignment is normal. Intervertebral disc spaces are maintained. IMPRESSION: Negative. Electronically Signed   By: Jasmine Pang M.D.   On: 03/04/2023 22:15   DG Hip Unilat W or Wo Pelvis 2-3 Views Left  Result Date: 03/04/2023 CLINICAL DATA:  Left hip pain, recent pending injury, initial encounter EXAM: DG HIP (WITH OR WITHOUT PELVIS) 2-3V LEFT COMPARISON:  None Available. FINDINGS: Pelvic ring is intact. No acute fracture or dislocation is noted. No soft tissue abnormality is seen. IMPRESSION: No acute abnormality noted. Electronically Signed   By: Alcide Clever M.D.   On: 03/04/2023 22:11    Procedures Procedures    Medications Ordered in ED Medications - No data to display  ED Course/ Medical Decision Making/ A&P                             Medical Decision Making Risk Prescription drug management.   Patient is a 32 year old male with no medical history -- he states he does not see a PCP but states he did have labs done last year which were normal per pt.   Patient presents emergency room today with complaints of left-sided low back pain that began around 3 PM when he was bending over to pick up an object on the ground he felt a sudden sharp pain in his left side and states that he has had that pain since although it is much improved.  He has not taken medications.  He states it is worse with certain movements specifically bending to his left.  He denies any middle of the back pain he denies any falls or trauma.  He is on a blood thinner as he is not a cancer patient denies any history of IV drug use.  He denies any chest pain or difficulty breathing he says that earlier it was painful leg elevated deep breath but that has resolved.  No urinary  symptoms, no bowel or bladder incontinence.  Denies any weakness or numbness of either leg  Broad differential for back pain considered includes malignancy, disc herniation, spinal epidural abscess, spinal fracture, cauda equina, pyelonephritis, kidney stone, AAA, AD, pancreatitis, PE and PTX.   History without  symptoms of urinary or stool retention or incontinence, neurologic changes such as sensation change or weakness lower extremities, coagulopathy or blood thinner use, is not elderly or with history of osteoporosis, denies any history of cancer, fever, IV drug use, weight changes (unexplained), or prolonged steroid use.   Physical exam most consistent with muscular strain. Doubt cauda equina or disc herniation d/t lack of saddle anesthesia/bowel or bladder incontinence or urinary retention, normal gait and reassuring physical examination without neurologic deficits.   History is not supportive of kidney stone, AAA, AD, pancreatitis, PE or PTX. Patient has no CVA tenderness or urinary sx to suggest pyelonephritis or kidney stone.   Will manage patient conservatively at this time. NSAIDs, back exercises/stretches, heat therapy and follow up with PCP if symptoms do not resolve in 3-4 weeks. Patient offered muscle relaxer for comfort at night. Counseled on need to return to ED for fever, worsening or concerning symptoms. Patient agreeable to plan and states understanding of follow up plans and return precautions.      Vitals WNL at time of discharge and patient is no acute distress  Provided patient with Lidoderm patches, 1 dose of Percocet here I will discharge home with Robaxin, recommend ideally Tylenol and ibuprofen and massage and conservative measures.  Will need to follow-up with orthopedics.  I discussed with the patient that because he is walking and we have a low suspicion for fracture it is reasonable to discharge with orthopedic follow-up.  He also agrees that he does not feel that he  has fractures anything.  Will discharge home at this time return precautions discussed.  Final Clinical Impression(s) / ED Diagnoses Final diagnoses:  Acute left-sided low back pain without sciatica  Left hip pain    Rx / DC Orders ED Discharge Orders     None         Gailen Shelter, [place] 03/05/23 0272    Zadie Rhine, MD 03/07/23 1007

## 2023-03-05 NOTE — Discharge Instructions (Addendum)
As we discussed, your xrays are normal and I agree with you that a fracture is unlikely. Since you're able to bear weight I suggest we take a somewhat conservative approach and start with Tylenol ibuprofen in alternating fashion as detailed below.  I have also prescribed you a muscle relaxer.  We also has put a lidocaine patch on to your low back to see if this helps some with the muscle pain there.  You will need to follow-up with an orthopedist and have given you the information for their office.  Often times there is acute pain after an injury such as this that improves with a few days.  If your symptoms do not improve in a week I recommend calling make an appointment with a orthopedic office.  I have given you the information for their office.  Keep in mind that Robaxin is a muscle relaxer that can cause drowsiness and can increase the likelihood of a fall.  Please be careful and do not operate vehicles or heavy machinery while you are taking this medication.  I have written you to work notes use whichever 1 you need and shred the other.  I have also given you the information for the [facility] health wellness clinic.  Please follow-up with them for primary care/blood pressure checks, etc.

## 2023-07-26 ENCOUNTER — Encounter (HOSPITAL_COMMUNITY): Payer: Self-pay

## 2023-07-26 ENCOUNTER — Other Ambulatory Visit: Payer: Self-pay

## 2023-07-26 ENCOUNTER — Emergency Department (HOSPITAL_COMMUNITY)
Admission: EM | Admit: 2023-07-26 | Discharge: 2023-07-27 | Disposition: A | Discharge status: Home / Self Care | Payer: Medicaid Other | Attending: Emergency Medicine | Admitting: Emergency Medicine

## 2023-07-26 ENCOUNTER — Ambulatory Visit (HOSPITAL_COMMUNITY)
Admission: EM | Admit: 2023-07-26 | Discharge: 2023-07-26 | Disposition: A | Discharge status: Home / Self Care | Payer: Medicaid Other

## 2023-07-26 ENCOUNTER — Emergency Department (HOSPITAL_COMMUNITY): Payer: Medicaid Other

## 2023-07-26 DIAGNOSIS — R000 Tachycardia, unspecified: Secondary | ICD-10-CM | POA: Diagnosis not present

## 2023-07-26 DIAGNOSIS — R0602 Shortness of breath: Secondary | ICD-10-CM

## 2023-07-26 DIAGNOSIS — R0600 Dyspnea, unspecified: Secondary | ICD-10-CM | POA: Diagnosis present

## 2023-07-26 DIAGNOSIS — R600 Localized edema: Secondary | ICD-10-CM | POA: Diagnosis not present

## 2023-07-26 DIAGNOSIS — R062 Wheezing: Secondary | ICD-10-CM | POA: Insufficient documentation

## 2023-07-26 LAB — BASIC METABOLIC PANEL
Anion gap: 13 (ref 5–15)
BUN: 17 mg/dL (ref 6–20)
CO2: 22 mmol/L (ref 22–32)
Calcium: 9.3 mg/dL (ref 8.9–10.3)
Chloride: 103 mmol/L (ref 98–111)
Creatinine, Ser: 1.32 mg/dL — ABNORMAL HIGH (ref 0.61–1.24)
GFR, Estimated: >60 mL/min (ref >60)
Glucose, Bld: 94 mg/dL (ref 70–99)
Potassium: 3.6 mmol/L (ref 3.5–5.1)
Sodium: 138 mmol/L (ref 135–145)

## 2023-07-26 LAB — CBC
HCT: 46.5 % (ref 39.0–52.0)
Hemoglobin: 14.2 g/dL (ref 13.0–17.0)
MCH: 25.8 pg — ABNORMAL LOW (ref 26.0–34.0)
MCHC: 30.5 g/dL (ref 30.0–36.0)
MCV: 84.5 fL (ref 80.0–100.0)
Platelets: 353 10*3/uL (ref 150–400)
RBC: 5.5 MIL/uL (ref 4.22–5.81)
RDW: 14.2 % (ref 11.5–15.5)
WBC: 14.7 10*3/uL — ABNORMAL HIGH (ref 4.0–10.5)
nRBC: 0 % (ref 0.0–0.2)

## 2023-07-26 LAB — TROPONIN I (HIGH SENSITIVITY): Troponin I (High Sensitivity): 5 ng/L (ref <18)

## 2023-07-26 MED ORDER — IPRATROPIUM-ALBUTEROL 0.5-2.5 (3) MG/3ML IN SOLN
3.0000 mL | RESPIRATORY_TRACT | Status: DC
  Administered 2023-07-27: 3 mL via RESPIRATORY_TRACT
  Filled 2023-07-26: qty 3

## 2023-07-26 NOTE — ED Provider Notes (Signed)
[facility]    CSN: [id number] Arrival date & time: 07/26/23  1608      History   Chief Complaint Chief Complaint  Patient presents with   Shortness of Breath   Ankle Pain   Mass    HPI Jay Pratt is a 32 y.o. male.   Patient presents today with a 1 day history of shortness of breath.  Reports that he was taking a shower when he developed sudden shortness of breath.  He reports associated chest discomfort as well as a cough.  He was not experiencing any URI symptoms before this began.  He denies history of VTE events.  Denies any recent hospitalization.  Reports 2 and half years ago he broke his right ankle but has had ongoing pain since that time.  Over the past several weeks he has had difficulty ambulating because of the severity of pain and so has been more sedentary.  He has not tried any over-the-counter medication for symptom management.  Denies history of malignancy.    History reviewed. No pertinent past medical history.  There are no problems to display for this patient.   History reviewed. No pertinent surgical history.     Home Medications    Prior to Admission medications   Medication Sig Start Date End Date Taking? Authorizing Provider  clindamycin (CLEOCIN) 300 MG capsule Take 1 capsule (300 mg total) by mouth 3 (three) times daily. 07/28/17   Rise Mu, PA-C  ibuprofen (ADVIL,MOTRIN) 200 MG tablet Take 400 mg by mouth every 6 (six) hours as needed. For pain    [provider]  lidocaine (XYLOCAINE) 2 % solution Use as directed 20 mLs in the mouth or throat as needed for mouth pain. 07/28/17   Rise Mu, PA-C  methocarbamol (ROBAXIN) 500 MG tablet Take 1 tablet (500 mg total) by mouth 2 (two) times daily. 03/05/23   Gailen Shelter, PA  traMADol (ULTRAM) 50 MG tablet Take 1 tablet (50 mg total) by mouth every 6 (six) hours as needed. 07/28/17   Rise Mu, PA-C    Family History History reviewed. No  pertinent family history.  Social History Social History   Tobacco Use   Smoking status: Never   Smokeless tobacco: Never  Vaping Use   Vaping status: Never Used  Substance Use Topics   Alcohol use: Yes    Comment: occasionally   Drug use: No     Allergies   Patient has no known allergies.   Review of Systems Review of Systems  Respiratory:  Positive for shortness of breath.   Cardiovascular:  Positive for chest pain and palpitations.  Musculoskeletal:  Positive for arthralgias and gait problem. Negative for myalgias.     Physical Exam Triage Vital Signs ED Triage Vitals  Encounter Vitals Group     BP 07/26/23 1659 (!) 157/81     Systolic BP Percentile --      Diastolic BP Percentile --      Pulse Rate 07/26/23 1657 (!) 130     Resp 07/26/23 1657 (!) 22     Temp 07/26/23 1657 98.3 F (36.8 C)     Temp Source 07/26/23 1657 Oral     SpO2 07/26/23 1657 91 %     Weight --      Height --      Head Circumference --      Peak Flow --      Pain Score 07/26/23 1658 8  Pain Loc --      Pain Education --      Exclude from Growth Chart --    No data found.  Updated Vital Signs BP (!) 157/81   Pulse (!) 130   Temp 98.3 F (36.8 C) (Oral)   Resp (!) 22   SpO2 93%   Visual Acuity Right Eye Distance:   Left Eye Distance:   Bilateral Distance:    Right Eye Near:   Left Eye Near:    Bilateral Near:     Physical Exam Vitals reviewed.  Constitutional:      General: He is awake.     Appearance: Normal appearance. He is well-developed. He is ill-appearing.     Comments: Very pleasant male presented age obviously uncomfortable but in no acute distress  HENT:     Head: Normocephalic and atraumatic.     Mouth/Throat:     Pharynx: No oropharyngeal exudate, posterior oropharyngeal erythema or uvula swelling.  Cardiovascular:     Rate and Rhythm: Regular rhythm. Tachycardia present.     Heart sounds: Normal heart sounds, S1 normal and S2 normal. No murmur  heard.    Comments: Negative Homans' sign. Pulmonary:     Effort: Pulmonary effort is normal. Tachypnea present.     Breath sounds: Normal breath sounds. No stridor. No wheezing, rhonchi or rales.  Musculoskeletal:     Right ankle: No swelling. Tenderness present over the medial malleolus.  Neurological:     Mental Status: He is alert.  Psychiatric:        Behavior: Behavior is cooperative.      UC Treatments / Results  Labs (all labs ordered are listed, but only abnormal results are displayed) Labs Reviewed - No data to display  EKG   Radiology No results found.  Procedures Procedures (including critical care time)  Medications Ordered in UC Medications - No data to display  Initial Impression / Assessment and Plan / UC Course  I have reviewed the triage vital signs and the nursing notes.  Pertinent labs & imaging results that were available during my care of the patient were reviewed by me and considered in my medical decision making (see chart for details).     Patient was tachycardic and tachypneic on intake.  Oxygen saturation fluctuated between 88% and 94% depending on if patient was active or moving around.  Discussed that given his clinical presentation I am concerned for a pulmonary embolism which would need to be worked up in the emergency room.  Offered transport but patient declined.  He will have his significant other take him directly to the emergency room.  He was stable at time of discharge.  Final Clinical Impressions(s) / UC Diagnoses   Final diagnoses:  Shortness of breath  Tachycardia     Discharge Instructions      Please go directly to the emergency room for further evaluation and management.    ED Prescriptions   None    PDMP not reviewed this encounter.   Jeani Hawking, PA-C 07/26/23 1724

## 2023-07-26 NOTE — Discharge Instructions (Signed)
Please go directly to the emergency room for further evaluation and management.

## 2023-07-26 NOTE — ED Triage Notes (Signed)
Pt is here with several complaints. C/o SOB that started last night.  Pt reports right ankle pain x 2 weeks. Pt reports a knot on his neck for several months.

## 2023-07-26 NOTE — ED Notes (Signed)
Patient is being discharged from the Urgent Care and sent to the Emergency Department via POV . Per Dorann Ou, PA, patient is in need of higher level of care due to concerns for PE. Patient is aware and verbalizes understanding of plan of care.  Vitals:   07/26/23 1659 07/26/23 1700  BP: (!) 157/81   Pulse:    Resp:    Temp:    SpO2:  93%

## 2023-07-26 NOTE — ED Triage Notes (Signed)
Pt reports shortness of breath that started last night after he got out of the shower. He started coughing as well with 2 episodes of emesis. Today he reports he coughed up brown phlegm this morning as well as another episode of emesis and chest heaviness.  He also reports right ankle pain x 2 years, denies recent injury.

## 2023-07-27 ENCOUNTER — Emergency Department (HOSPITAL_COMMUNITY): Payer: Medicaid Other

## 2023-07-27 DIAGNOSIS — R0602 Shortness of breath: Secondary | ICD-10-CM | POA: Diagnosis not present

## 2023-07-27 LAB — TROPONIN I (HIGH SENSITIVITY): Troponin I (High Sensitivity): 6 ng/L (ref <18)

## 2023-07-27 LAB — BRAIN NATRIURETIC PEPTIDE: B Natriuretic Peptide: 5.8 pg/mL (ref 0.0–100.0)

## 2023-07-27 MED ORDER — IOHEXOL 350 MG/ML SOLN
75.0000 mL | Freq: Once | INTRAVENOUS | Status: AC | PRN
  Administered 2023-07-27: 75 mL via INTRAVENOUS

## 2023-07-27 MED ORDER — ALBUTEROL SULFATE HFA 108 (90 BASE) MCG/ACT IN AERS
2.0000 | INHALATION_SPRAY | Freq: Once | RESPIRATORY_TRACT | Status: AC
  Administered 2023-07-27: 2 via RESPIRATORY_TRACT
  Filled 2023-07-27: qty 6.7

## 2023-07-27 MED ORDER — AEROCHAMBER PLUS FLO-VU MISC
1.0000 | Freq: Once | Status: AC
  Administered 2023-07-27: 1
  Filled 2023-07-27: qty 1

## 2023-07-27 MED ORDER — DEXAMETHASONE 4 MG PO TABS
16.0000 mg | ORAL_TABLET | Freq: Once | ORAL | Status: AC
  Administered 2023-07-27: 16 mg via ORAL
  Filled 2023-07-27: qty 4

## 2023-07-27 NOTE — ED Provider Notes (Signed)
[place] EMERGENCY DEPARTMENT AT [facility] Provider Note   CSN: [id number] Arrival date & time: 07/26/23  1726     History  Chief Complaint  Patient presents with   Shortness of Breath    Ona Sedgwick is a 32 y.o. male.  32 year old male who presents ER today secondary to dyspnea.  Patient initially went to urgent care.  Sounds like his been sick for at least a few days.  He has had multiple episodes of coughing and had a couple episodes of posttussive emesis as well.  No fevers.  No known sick contacts.  Patient states he is felt near syncopal at times.  He also has a history of a right ankle fracture and states that that leg seems to be more swollen than normal and having pain around the ankle. No palpitations.    Shortness of Breath      Home Medications Prior to Admission medications   Not on File      Allergies    Patient has no known allergies.    Review of Systems   Review of Systems  Respiratory:  Positive for shortness of breath.     Physical Exam Updated Vital Signs BP (!) 155/104 (BP Location: Right Arm)   Pulse (!) 101   Temp 97.8 F (36.6 C) (Oral)   Resp 20   Ht 6' (1.829 m)   Wt (!) 147.4 kg   SpO2 98%   BMI 44.08 kg/m  Physical Exam Vitals and nursing note reviewed.  Constitutional:      Appearance: He is well-developed.  HENT:     Head: Normocephalic and atraumatic.  Cardiovascular:     Rate and Rhythm: Normal rate.  Pulmonary:     Effort: Pulmonary effort is normal. No respiratory distress.     Breath sounds: Decreased breath sounds and wheezing present.  Abdominal:     General: There is no distension.  Musculoskeletal:        General: Normal range of motion.     Cervical back: Normal range of motion.     Right lower leg: Edema present.     Left lower leg: No edema.  Neurological:     Mental Status: He is alert.     ED Results / Procedures / Treatments   Labs (all labs ordered are listed, but only  abnormal results are displayed) Labs Reviewed  BASIC METABOLIC PANEL - Abnormal; Notable for the following components:      Result Value   Creatinine, Ser 1.32 (*)    All other components within normal limits  CBC - Abnormal; Notable for the following components:   WBC 14.7 (*)    MCH 25.8 (*)    All other components within normal limits  BRAIN NATRIURETIC PEPTIDE  TROPONIN I (HIGH SENSITIVITY)  TROPONIN I (HIGH SENSITIVITY)    EKG EKG Interpretation Date/Time:  Saturday July 26 2023 17:36:04 EDT Ventricular Rate:  116 PR Interval:  134 QRS Duration:  80 QT Interval:  298 QTC Calculation: 414 R Axis:   69  Text Interpretation: Sinus tachycardia Nonspecific T wave abnormality Abnormal ECG When compared with ECG of 01-Sep-2018 16:09, PREVIOUS ECG IS PRESENT Confirmed by Marily Memos [phone]) on 07/26/2023 11:16:58 PM  Radiology CT Angio Chest PE W and/or Wo Contrast  Result Date: 07/27/2023 CLINICAL DATA:  Shortness of breath. EXAM: CT ANGIOGRAPHY CHEST WITH CONTRAST TECHNIQUE: Multidetector CT imaging of the chest was performed using the standard protocol during bolus administration of intravenous  contrast. Multiplanar CT image reconstructions and MIPs were obtained to evaluate the vascular anatomy. RADIATION DOSE REDUCTION: This exam was performed according to the departmental dose-optimization program which includes automated exposure control, adjustment of the mA and/or kV according to patient size and/or use of iterative reconstruction technique. CONTRAST:  75mL OMNIPAQUE IOHEXOL 350 MG/ML SOLN COMPARISON:  None Available. FINDINGS: Cardiovascular: The thoracic aorta is normal in appearance. The subsegmental pulmonary arteries are limited in evaluation secondary to suboptimal opacification with intravenous contrast and areas of overlying artifact. This is most prominent within the bilateral lower lobes. No evidence of pulmonary embolism. Normal heart size. No pericardial  effusion. Mediastinum/Nodes: No enlarged mediastinal, hilar, or axillary lymph nodes. Thyroid gland, trachea, and esophagus demonstrate no significant findings. Lungs/Pleura: Lungs are clear. No pleural effusion or pneumothorax. Upper Abdomen: No acute abnormality. Musculoskeletal: No chest wall abnormality. No acute or significant osseous findings. Review of the MIP images confirms the above findings. IMPRESSION: No evidence of pulmonary embolism or acute cardiopulmonary disease. Electronically Signed   By: Aram Candela M.D.   On: 07/27/2023 04:19   DG Chest 2 View  Result Date: 07/26/2023 CLINICAL DATA:  Shortness of breath EXAM: CHEST - 2 VIEW COMPARISON:  08/01/2008 FINDINGS: The heart size and mediastinal contours are within normal limits. Both lungs are clear. The visualized skeletal structures are unremarkable. IMPRESSION: No active cardiopulmonary disease. Electronically Signed   By: Duanne Guess D.O.   On: 07/26/2023 18:53    Procedures Angiocath insertion  Date/Time: 07/27/2023 2:39 AM  Performed by: Marily Memos, MD Authorized by: Marily Memos, MD  Consent: Verbal consent obtained. Risks and benefits: risks, benefits and alternatives were discussed Consent given by: patient Patient understanding: patient states understanding of the procedure being performed Required items: required blood products, implants, devices, and special equipment available Patient identity confirmed: verbally with patient Time out: Immediately prior to procedure a "time out" was called to verify the correct patient, procedure, equipment, support staff and site/side marked as required. Preparation: Patient was prepped and draped in the usual sterile fashion. Local anesthesia used: no  Anesthesia: Local anesthesia used: no  Sedation: Patient sedated: no  Patient tolerance: patient tolerated the procedure well with no immediate complications Comments: 18g IV superficial right AC        Medications Ordered in ED Medications  iohexol (OMNIPAQUE) 350 MG/ML injection 75 mL (75 mLs Intravenous Contrast Given 07/27/23 0350)  dexamethasone (DECADRON) tablet 16 mg (16 mg Oral Given 07/27/23 0451)  albuterol (VENTOLIN HFA) 108 (90 Base) MCG/ACT inhaler 2 puff (2 puffs Inhalation Given 07/27/23 0454)  aerochamber plus with mask device 1 each (1 each Other Given 07/27/23 0502)    ED Course/ Medical Decision Making/ A&P                                 Medical Decision Making Amount and/or Complexity of Data Reviewed Labs: ordered. Radiology: ordered.  Risk Prescription drug management.  Signs and symptoms certainly seem to be more consistent with bronchitis or some type of infectious issue however when he went to urgent care his sats were 91% and his heart rate was 130 and he states that the swelling around his right leg is worse than normal.  Does not have any calf tenderness or pain with flexion or extension of his calf.  However I feel he has enough risk factors for PE to warrant a CTA.  Will also give him  a breathing treatment see if that helps.  On reevaluation patient states he does feel quite better with the breathing treatment.  Having trouble getting an IV and is requesting to go home.  I discussed his abnormal vital signs at urgent care and the concern that I had and he was more willing to stay at that point as long as he could have something to eat.  Ultrasound-guided peripheral IV placed as above by myself.  Ready for CT.  Patient with hypoxia to 90 when sleeping.  When he awakes but is back above 95.  After discussion with him and his fiance I suspect he probably has sleep apnea as he does sometimes stop breathing at night and wake up short of breath or coughing.  Does not have restful sleep.  Will refer to pulmonology for sleep apnea.  CT scan viewed interpreted by myself without any obvious pneumothorax, pneumonia or pulmonary embolus.  Radiology read reviewed as  well.  Bronchitis is most likely cause.  No evidence of a bacterial cause.  Will treat with steroids and albuterol.  Follow-up with pulmonology for OSA and the bronchitis.  Stable for discharge at this time.   Final Clinical Impression(s) / ED Diagnoses Final diagnoses:  Wheezing    Rx / DC Orders ED Discharge Orders          Ordered    Ambulatory referral to Pulmonology        07/27/23 0436              Ramani Riva, Barbara Cower, MD 07/27/23 [phone]

## 2023-07-27 NOTE — ED Notes (Signed)
PT stated that he wanted to leave AMA, MD was made aware.

## 2023-08-05 ENCOUNTER — Telehealth: Payer: Self-pay

## 2023-08-05 NOTE — Telephone Encounter (Signed)
  Medicaid Managed Care   Unsuccessful Outreach Note  08/05/2023 Name: Jay Pratt MRN: [id number] DOB: 16-Aug-1991  Referred by: Default, Provider, MD Reason for referral : No chief complaint on file.   An unsuccessful telephone outreach was attempted today. The patient was referred to the case management team for assistance with care management and care coordination.   Follow Up Plan: If patient returns call to provider office, please advise to call Embedded Care Management Care Guide Nicholes Rough* at [phone]*  Nicholes Rough, CMA Care Guide [employer]

## 2023-09-22 ENCOUNTER — Ambulatory Visit: Payer: Medicaid Other | Admitting: Nurse Practitioner

## 2023-09-26 ENCOUNTER — Ambulatory Visit: Payer: Self-pay | Admitting: Nurse Practitioner

## 2023-10-01 ENCOUNTER — Ambulatory Visit: Payer: Self-pay | Admitting: Nurse Practitioner

## 2023-11-12 DIAGNOSIS — Z0189 Encounter for other specified special examinations: Secondary | ICD-10-CM | POA: Diagnosis not present

## 2023-11-12 DIAGNOSIS — Z8481 Family history of carrier of genetic disease: Secondary | ICD-10-CM | POA: Diagnosis not present

## 2024-03-15 ENCOUNTER — Emergency Department (HOSPITAL_BASED_OUTPATIENT_CLINIC_OR_DEPARTMENT_OTHER)
Admission: EM | Admit: 2024-03-15 | Discharge: 2024-03-15 | Disposition: A | Discharge status: Home / Self Care | Attending: Emergency Medicine | Admitting: Emergency Medicine

## 2024-03-15 ENCOUNTER — Emergency Department (HOSPITAL_BASED_OUTPATIENT_CLINIC_OR_DEPARTMENT_OTHER): Admitting: Radiology

## 2024-03-15 ENCOUNTER — Encounter (HOSPITAL_BASED_OUTPATIENT_CLINIC_OR_DEPARTMENT_OTHER): Payer: Self-pay | Admitting: Emergency Medicine

## 2024-03-15 ENCOUNTER — Other Ambulatory Visit: Payer: Self-pay

## 2024-03-15 DIAGNOSIS — M25571 Pain in right ankle and joints of right foot: Secondary | ICD-10-CM

## 2024-03-15 DIAGNOSIS — W19XXXA Unspecified fall, initial encounter: Secondary | ICD-10-CM

## 2024-03-15 DIAGNOSIS — W172XXA Fall into hole, initial encounter: Secondary | ICD-10-CM | POA: Diagnosis not present

## 2024-03-15 DIAGNOSIS — M25511 Pain in right shoulder: Secondary | ICD-10-CM | POA: Diagnosis not present

## 2024-03-15 DIAGNOSIS — M7989 Other specified soft tissue disorders: Secondary | ICD-10-CM | POA: Diagnosis not present

## 2024-03-15 DIAGNOSIS — S99811A Other specified injuries of right ankle, initial encounter: Secondary | ICD-10-CM | POA: Diagnosis not present

## 2024-03-15 MED ORDER — MELOXICAM 15 MG PO TABS: 15.0000 mg | ORAL_TABLET | Freq: Every day | ORAL | 0 refills | Status: AC | PRN

## 2024-03-15 MED ORDER — KETOROLAC TROMETHAMINE 30 MG/ML IJ SOLN
30.0000 mg | Freq: Once | INTRAMUSCULAR | Status: AC
  Administered 2024-03-15: 30 mg via INTRAMUSCULAR
  Filled 2024-03-15: qty 1

## 2024-03-15 MED ORDER — MELOXICAM 15 MG PO TABS: 15.0000 mg | ORAL_TABLET | Freq: Every day | ORAL | 0 refills | Status: DC | PRN

## 2024-03-15 NOTE — ED Provider Notes (Signed)
 [place] EMERGENCY DEPARTMENT AT [facility] Provider Note   CSN: [id number] Arrival date & time: 03/15/24  1210     History  Chief Complaint  Patient presents with   Ankle Pain    Ronal Kalal is a 33 y.o. male.   Ankle Pain   33 year old male presents emergency department with complaints of right-sided ankle pain.  States that he stepped in a hole 3 weeks ago and rolled her right ankle inward.  States has had persistent pain since then.  Denies any repeat trauma/falls.  Denies any weakness/sensory deficits.  Has been trying ibuprofen with some improvement of symptoms.  Presents emergency department due to continued pain.  No significant pertinent past medical history.  Home Medications Prior to Admission medications   Medication Sig Start Date End Date Taking? Authorizing Provider  meloxicam (MOBIC) 15 MG tablet Take 1 tablet (15 mg total) by mouth daily as needed. 03/15/24   [place] Butter, PA      Allergies    Patient has no known allergies.    Review of Systems   Review of Systems  All other systems reviewed and are negative.   Physical Exam Updated Vital Signs BP (!) 151/83 (BP Location: Left Arm)   Pulse 78   Temp 98.7 F (37.1 C) (Oral)   Resp 18   SpO2 99%  Physical Exam Vitals and nursing note reviewed.  Constitutional:      General: He is not in acute distress.    Appearance: He is well-developed.  HENT:     Head: Normocephalic and atraumatic.  Eyes:     Conjunctiva/sclera: Conjunctivae normal.  Cardiovascular:     Rate and Rhythm: Normal rate and regular rhythm.     Heart sounds: No murmur heard. Pulmonary:     Effort: Pulmonary effort is normal. No respiratory distress.     Breath sounds: Normal breath sounds.  Abdominal:     Palpations: Abdomen is soft.     Tenderness: There is no abdominal tenderness.  Musculoskeletal:        General: No swelling.     Cervical back: Neck supple.     Comments: Pedal pulses and  posterior tibial pulses 2+ bilaterally.  No overlying erythema, palpable fluctuance or induration.  Tender to palpation right lateral malleolus anteriorly as well as posteriorly.  Full range of motion right ankle and digits.  Full range of motion right shoulder limited with pain with overhead movements.  Radial pulses 2+ bilateral.  No overlying erythema, palpable redness/induration.  No extremity edema.  No lower extremity edema.  Muscular strength 5-5 bilateral upper and lower extremities.  Skin:    General: Skin is warm and dry.     Capillary Refill: Capillary refill takes less than 2 seconds.  Neurological:     Mental Status: He is alert.  Psychiatric:        Mood and Affect: Mood normal.     ED Results / Procedures / Treatments   Labs (all labs ordered are listed, but only abnormal results are displayed) Labs Reviewed - No data to display  EKG None  Radiology DG Ankle Complete Right Result Date: 03/15/2024 CLINICAL DATA:  Right ankle pain for 3 weeks. EXAM: RIGHT ANKLE - COMPLETE 3+ VIEW COMPARISON:  None Available. FINDINGS: The ankle mortise is symmetric and intact. Minimal chronic enthesopathic change at the Achilles insertion on the calcaneus. Moderate lateral and anterior ankle soft tissue swelling. On frontal view, overlapping bones limit evaluation, however there may  be a 3 x 1 mm bone density overlying the distal mid transverse dimension of the fibula and adjacent lateral talus which could represent a small avulsion injury. Joint spaces are preserved.  No dislocation. IMPRESSION: 1. Moderate lateral and anterior ankle soft tissue swelling. 2. On frontal view, there may be a 3 x 1 mm bone density overlying the distal mid transverse dimension of the fibula and adjacent lateral talus which could represent a small avulsion injury. Recommend clinical correlation for point tenderness. Electronically Signed   By: Bertina Broccoli M.D.   On: 03/15/2024 14:08    Procedures Procedures     Medications Ordered in ED Medications  ketorolac  (TORADOL ) 30 MG/ML injection 30 mg (30 mg Intramuscular Given 03/15/24 1420)    ED Course/ Medical Decision Making/ A&P                                 Medical Decision Making Amount and/or Complexity of Data Reviewed Radiology: ordered.  Risk Prescription drug management.   This patient presents to the ED for concern of ankle pain, this involves an extensive number of treatment options, and is a complaint that carries with it a high risk of complications and morbidity.  The differential diagnosis includes fracture, strain/sprain, dislocation, ligamentous/tendinous injury, neurovascular compromise, ischemic limb, cellulitis, septic arthritis, osteoarthritis, other   Co morbidities that complicate the patient evaluation  See HPI   Additional history obtained:  Additional history obtained from EMR External records from outside source obtained and reviewed including hospital records   Lab Tests:  N/a   Imaging Studies ordered:  I ordered imaging studies including left ankle x-ray, right shoulder x-ray I independently visualized and interpreted imaging which showed  Left ankle x-ray: Moderate lateral and anterior ankle soft tissue swelling.  3 x 1 mm bone density overlying distal mid transverse dimension of fibula adjacent lateral talus.  Possible avulsion injury Right shoulder x-ray: No acute osseous abnormality. I agree with the radiologist interpretation  Cardiac Monitoring: / EKG:  N/a   Consultations Obtained:  N/a   Problem List / ED Course / Critical interventions / Medication management  Right ankle pain I ordered medication including Toradol    Reevaluation of the patient after these medicines showed that the patient improved I have reviewed the patients home medicines and have made adjustments as needed   Social Determinants of Health:  Denies tobacco, licit drug use.   Test / Admission -  Considered:  Right ankle pain Vitals signs significant for hypertension blood pressure 151/83. Otherwise within normal range and stable throughout visit. Imaging studies significant for: see above 33 year old male presents emergency department with complaints of right-sided ankle pain.  States that he stepped in a hole 3 weeks ago and rolled her right ankle inward.  States has had persistent pain since then.  Denies any repeat trauma/falls.  Denies any weakness/sensory deficits.  Has been trying ibuprofen with some improvement of symptoms.  Presents emergency department due to continued pain. On exam, tenderness most of the right lateral malleolus right foot/ankle exam.  No pulse deficits to suggest ischemic limb. Skin changes concerning for tendon infectious process.  Localized swelling but no lower extremity pitting edema concerning for DVT.  X-ray obtained concerning for possible avulsion injury.  Patient placed in cam walker boot as well as given crutches toe pain ambulation.  Recommend symptomatic therapy as described in AVS and close follow-up with orthopedics in the outpatient  setting.  Treatment plan discussed with patient and he did not understanding was agreeable to said plan.  Patient overall well-appearing, afebrile in no acute distress. Worrisome signs and symptoms were discussed with the patient, and the patient acknowledged understanding to return to the ED if noticed. Patient was stable upon discharge.          Final Clinical Impression(s) / ED Diagnoses Final diagnoses:  None    Rx / DC Orders ED Discharge Orders          Ordered    meloxicam (MOBIC) 15 MG tablet  Daily PRN,   Status:  Discontinued        03/15/24 1539    meloxicam (MOBIC) 15 MG tablet  Daily PRN        03/15/24 1549              [place] Butter, [place] 03/15/24 1733    Scarlette Currier, MD 03/16/24 0025

## 2024-03-15 NOTE — ED Triage Notes (Signed)
 C/o R ankle pain x 3 weeks. Denies any new injuries to area.

## 2024-03-15 NOTE — ED Notes (Signed)
 Reviewed AVS/discharge instruction with patient. Time allotted for and all questions answered. Patient is agreeable for d/c and escorted to ed exit by staff.

## 2024-03-15 NOTE — Discharge Instructions (Signed)
 As discussed, there is concern that you have an avulsion injury on your right ankle.  Recommend continued use of anti-inflammatories for treatment of pain as well as icing your ankle and elevating your leg above the level of heart to help with pain/swelling.  Attaches number for orthopedics to follow-up with.  X-ray of your right shoulder did not show any obvious abnormality.  Please do not hesitate to return to emergency department if the worrisome signs and symptoms we discussed become apparent.

## 2024-08-19 ENCOUNTER — Emergency Department (HOSPITAL_BASED_OUTPATIENT_CLINIC_OR_DEPARTMENT_OTHER): Admitting: Radiology

## 2024-08-19 ENCOUNTER — Other Ambulatory Visit: Payer: Self-pay

## 2024-08-19 ENCOUNTER — Emergency Department (HOSPITAL_BASED_OUTPATIENT_CLINIC_OR_DEPARTMENT_OTHER)
Admission: EM | Admit: 2024-08-19 | Discharge: 2024-08-19 | Disposition: A | Discharge status: Home / Self Care | Attending: Emergency Medicine | Admitting: Emergency Medicine

## 2024-08-19 DIAGNOSIS — M25571 Pain in right ankle and joints of right foot: Secondary | ICD-10-CM | POA: Insufficient documentation

## 2024-08-19 MED ORDER — NAPROXEN 500 MG PO TABS
500.0000 mg | ORAL_TABLET | Freq: Two times a day (BID) | ORAL | 0 refills | Status: AC
Start: 2024-08-19 — End: 2024-08-26

## 2024-08-19 MED ORDER — HYDROCODONE-ACETAMINOPHEN 5-325 MG PO TABS
1.0000 | ORAL_TABLET | Freq: Once | ORAL | Status: AC
  Administered 2024-08-19: 1 via ORAL
  Filled 2024-08-19: qty 1

## 2024-08-19 NOTE — Discharge Instructions (Addendum)
 Your xray today did not show any acute findings.   I have prescribed a short course of anti-inflammatories to help with your pain, please take 1 tablet twice a day with food for the next 7 days.

## 2024-08-19 NOTE — ED Provider Notes (Signed)
 [place] EMERGENCY DEPARTMENT AT [facility] Provider Note   CSN: [id number] Arrival date & time: 08/19/24  1453     Patient presents with: Ankle Pain   Jay Pratt is a 33 y.o. male.   33 year old male presents to the ED with a chief complaint of right ankle pain which has been ongoing for the past 2 weeks.  Patient reports he previously had an injury to the right foot.  He reports that every time he tries to ambulate his ankle hurts more.  He reports ambulating at work with worsening pain.  He has been taking some Tylenol  to help with pain control without much improvement in symptoms.He states when this occurred in the past, he was told to follow-up with orthopedics but he was unable to do so.  Denies any trauma, no fever, no other complaints reported.  The history is provided by the patient.  Ankle Pain Associated symptoms: no fever        Prior to Admission medications   Medication Sig Start Date End Date Taking? Authorizing Provider  naproxen  (NAPROSYN ) 500 MG tablet Take 1 tablet (500 mg total) by mouth 2 (two) times daily for 7 days. 08/19/24 08/26/24 Yes Taiwo Fish, PA-C  meloxicam  (MOBIC ) 15 MG tablet Take 1 tablet (15 mg total) by mouth daily as needed. 03/15/24   Silver Wonda LABOR, PA    Allergies: Patient has no known allergies.    Review of Systems  Constitutional:  Negative for fever.  Musculoskeletal:  Positive for arthralgias.    Updated Vital Signs BP (!) 177/111 (BP Location: Right Arm)   Pulse 100   Temp 97.9 F (36.6 C)   Resp 18   SpO2 95%   Physical Exam Vitals and nursing note reviewed.  Constitutional:      Appearance: Normal appearance.  HENT:     Head: Normocephalic and atraumatic.     Mouth/Throat:     Mouth: Mucous membranes are moist.  Cardiovascular:     Rate and Rhythm: Normal rate.  Pulmonary:     Effort: Pulmonary effort is normal.  Abdominal:     General: Abdomen is flat.     Palpations: Abdomen is soft.   Musculoskeletal:     Cervical back: Normal range of motion and neck supple.     Right ankle: Swelling present. No lacerations. Tenderness present over the lateral malleolus.  Skin:    General: Skin is warm and dry.  Neurological:     Mental Status: He is alert and oriented to person, place, and time.     (all labs ordered are listed, but only abnormal results are displayed) Labs Reviewed - No data to display  EKG: None  Radiology: DG Ankle Complete Right Result Date: 08/19/2024 EXAM: 3 VIEW(S) XRAY OF THE RIGHT ANKLE 08/19/2024 03:48:00 PM CLINICAL HISTORY: [id number] Swelling of ankle [id number]. Triage note: Patient states right ankle pain for the past 2 weeks with swelling. States twisted it several years ago and is concerned he re-injured it. Patient states right ankle pain for the past 2 weeks with swelling. States twisted it several years ago and is concerned he re-injured it. COMPARISON: None available. FINDINGS: BONES AND JOINTS: No acute fracture. No focal osseous lesion. No joint dislocation. SOFT TISSUES: There is diffuse soft tissue swelling of the ankle. IMPRESSION: 1. No acute osseous abnormality. 2. Diffuse soft tissue swelling of the right ankle. Electronically signed by: Greig Pique MD 08/19/2024 04:09 PM EDT RP Workstation: [id number]  Procedures   Medications Ordered in the ED  HYDROcodone-acetaminophen  (NORCO/VICODIN) 5-325 MG per tablet 1 tablet (1 tablet Oral Given 08/19/24 1942)                                    Medical Decision Making Amount and/or Complexity of Data Reviewed Radiology: ordered.   Present to the ED with chief complaint of right ankle pain, has been ongoing for the past 2 weeks.  Reports that every time he steps it does hurt.  He does note prior history of an injury to the right ankle, was referred to orthopedics but never followed up with them.  He reports taking over-the-counter medication without much improvement in symptoms, his foot  particularly hurts after a workday.  Today's visit does not show any dislocation, no fracture.  He was given Norco to help with pain control.  We discussed supportive treatment with anti-inflammatories, RICE therapy.  Will provide him with orthopedic referral, will have them follow-up at his earliest convenience.  Hemodynamically stable for discharge.  Portions of this note were generated with Scientist, clinical (histocompatibility and immunogenetics). Dictation errors may occur despite best attempts at proofreading.   Final diagnoses:  Acute right ankle pain    ED Discharge Orders          Ordered    naproxen  (NAPROSYN ) 500 MG tablet  2 times daily        08/19/24 1942               Elycia Woodside, PA-C 08/19/24 1945    Tegeler, Lonni PARAS, MD 08/19/24 2322

## 2024-08-19 NOTE — ED Triage Notes (Signed)
 Patient states right ankle pain for the past 2 weeks with swelling. States twisted it several years ago and is concerned he re-injured it.
